# Patient Record
Sex: Male | Born: 1970 | Race: White | Hispanic: No | Marital: Single | Smoking: Never smoker
Health system: Southern US, Community
[De-identification: ages and names within clinical notes are randomized; demographics above are authoritative.]

---

## 2017-08-20 ENCOUNTER — Encounter (HOSPITAL_COMMUNITY): Payer: Self-pay

## 2017-08-20 ENCOUNTER — Emergency Department (HOSPITAL_COMMUNITY): Payer: Self-pay

## 2017-08-20 ENCOUNTER — Emergency Department (HOSPITAL_COMMUNITY)
Admission: EM | Admit: 2017-08-20 | Discharge: 2017-08-20 | Disposition: A | Discharge status: Home / Self Care | Payer: Self-pay | Attending: Emergency Medicine | Admitting: Emergency Medicine

## 2017-08-20 DIAGNOSIS — W010XXA Fall on same level from slipping, tripping and stumbling without subsequent striking against object, initial encounter: Secondary | ICD-10-CM | POA: Insufficient documentation

## 2017-08-20 DIAGNOSIS — Y939 Activity, unspecified: Secondary | ICD-10-CM | POA: Insufficient documentation

## 2017-08-20 DIAGNOSIS — S4991XA Unspecified injury of right shoulder and upper arm, initial encounter: Secondary | ICD-10-CM | POA: Insufficient documentation

## 2017-08-20 DIAGNOSIS — Y929 Unspecified place or not applicable: Secondary | ICD-10-CM | POA: Insufficient documentation

## 2017-08-20 DIAGNOSIS — Y999 Unspecified external cause status: Secondary | ICD-10-CM | POA: Insufficient documentation

## 2017-08-20 MED ORDER — CYCLOBENZAPRINE HCL 10 MG PO TABS
10.0000 mg | ORAL_TABLET | Freq: Once | ORAL | Status: DC
  Filled 2017-08-20: qty 1

## 2017-08-20 MED ORDER — CYCLOBENZAPRINE HCL 10 MG PO TABS: 10.0000 mg | ORAL_TABLET | Freq: Two times a day (BID) | ORAL | 0 refills | Status: AC | PRN

## 2017-08-20 MED ORDER — OXYCODONE-ACETAMINOPHEN 5-325 MG PO TABS
1.0000 | ORAL_TABLET | Freq: Once | ORAL | Status: DC
  Filled 2017-08-20: qty 1

## 2017-08-20 MED ORDER — IBUPROFEN 800 MG PO TABS
800.0000 mg | ORAL_TABLET | Freq: Once | ORAL | Status: AC
Start: 2017-08-20 — End: 2017-08-20
  Administered 2017-08-20: 800 mg via ORAL
  Filled 2017-08-20: qty 1

## 2017-08-20 MED ORDER — DICLOFENAC SODIUM 50 MG PO TBEC: 50.0000 mg | DELAYED_RELEASE_TABLET | Freq: Two times a day (BID) | ORAL | 0 refills | Status: AC

## 2017-08-20 NOTE — ED Triage Notes (Signed)
Pt reports that he fell last night when it was wet outside. Denies hitting head or LOC. Endorses R shoulder pain and a bump over his R shoulder. A&Ox4. Spanish speaking.

## 2017-08-20 NOTE — Discharge Instructions (Signed)
Do not drive while taking the muscle relaxer as it can make you sleepy. If your pain continues follow up with Dr. Luiz BlareGraves for further evaluation.

## 2017-08-20 NOTE — ED Notes (Signed)
When asking to perform d/c vitals. He states "No, Its okay."

## 2017-08-20 NOTE — ED Provider Notes (Signed)
[facility]-EMERGENCY DEPT Provider Note   CSN: [id number] Arrival date & time: 08/20/17  1647     History   Chief Complaint Chief Complaint  Patient presents with  . Fall  . Arm Pain    HPI [facility] is a 47 y.o. male who presents to the ED with right shoulder pain s/p fall that occurred last night. Patient reports he went outside and and it was wet causing him to slip and fall on concrete. He landed on his right shoulder. Patient has taken nothing for pain.   The history is provided by the patient. A language interpreter was used.  Fall  This is a new problem. The current episode started 12 to 24 hours ago. The problem occurs constantly. The problem has been gradually worsening. Exacerbated by: moving arm. Nothing relieves the symptoms. He has tried nothing for the symptoms.  Shoulder Injury  This is a new problem. The current episode started yesterday. The problem occurs constantly. The problem has been gradually worsening. Exacerbated by: movement of shoulder. Nothing relieves the symptoms. He has tried nothing for the symptoms.    History reviewed. No pertinent past medical history.  There are no active problems to display for this patient.   History reviewed. No pertinent surgical history.     Home Medications    Prior to Admission medications   Medication Sig Start Date End Date Taking? Authorizing Provider  cyclobenzaprine (FLEXERIL) 10 MG tablet Take 1 tablet (10 mg total) by mouth 2 (two) times daily as needed for muscle spasms. 08/20/17   Janne NapoleonNeese, Hope M, NP  diclofenac (VOLTAREN) 50 MG EC tablet Take 1 tablet (50 mg total) by mouth 2 (two) times daily. 08/20/17   Janne NapoleonNeese, Hope M, NP    Family History History reviewed. No pertinent family history.  Social History Social History   Tobacco Use  . Smoking status: Never Smoker  . Smokeless tobacco: Never Used  Substance Use Topics  . Alcohol use: Not on file  . Drug use: Not  on file     Allergies   Patient has no known allergies.   Review of Systems Review of Systems  Musculoskeletal: Positive for arthralgias.       Right shoulder pain  Skin: Negative for wound.  All other systems reviewed and are negative.    Physical Exam Updated Vital Signs BP 129/89 (BP Location: Left Arm)   Pulse 84   Temp 98.4 F (36.9 C) (Oral)   Resp 18   SpO2 99%   Physical Exam  Constitutional: He appears well-developed and well-nourished. No distress.  HENT:  Head: Normocephalic and atraumatic.  Eyes: EOM are normal.  Neck: Neck supple.  Cardiovascular: Normal rate.  Pulmonary/Chest: Effort normal.  Musculoskeletal:       Right shoulder: He exhibits decreased range of motion, tenderness, pain and spasm. He exhibits no deformity, no laceration, normal pulse and normal strength.  Grips are equal, radial pulses 2+. Tender with palpation to the anterior aspect of the right shoulder. Patient can raise his arm but causes pain.  Neurological: He is alert.  Skin: Skin is warm and dry.  Psychiatric: He has a normal mood and affect.  Nursing note and vitals reviewed.    ED Treatments / Results  Labs (all labs ordered are listed, but only abnormal results are displayed) Labs Reviewed - No data to display Radiology Dg Shoulder Right  Result Date: 08/20/2017 CLINICAL DATA:  Fall last night.  Right superior shoulder pain.  EXAM: RIGHT SHOULDER - 2+ VIEW COMPARISON:  None. FINDINGS: There is no evidence of fracture or dislocation. There is no evidence of arthropathy or other focal bone abnormality. Soft tissues are unremarkable. IMPRESSION: Negative. Electronically Signed   By: Amie Portland M.D.   On: 08/20/2017 19:24    Procedures Procedures (including critical care time)  Medications Ordered in ED Medications  cyclobenzaprine (FLEXERIL) tablet 10 mg (10 mg Oral Not Given 08/20/17 2006)  oxyCODONE-acetaminophen (PERCOCET/ROXICET) 5-325 MG per tablet 1 tablet (1  tablet Oral Not Given 08/20/17 2006)  ibuprofen (ADVIL,MOTRIN) tablet 800 mg (800 mg Oral Given 08/20/17 2005)     Initial Impression / Assessment and Plan / ED Course  I have reviewed the triage vital signs and the nursing notes. 47 y.o. male here with right shoulder pain s/p fall last night stable for d/c without fracture or dislocation noted on x-ray. And no focal neuro deficits.  Patient treated with sling and NSAIDS while in the ED. Patient reports decreased pain after treatment. Return precautions discussed. Patient to f/u with ortho if symptoms persist.   Final Clinical Impressions(s) / ED Diagnoses   Final diagnoses:  Right shoulder injury, initial encounter    ED Discharge Orders        Ordered    cyclobenzaprine (FLEXERIL) 10 MG tablet  2 times daily PRN     08/20/17 2052    diclofenac (VOLTAREN) 50 MG EC tablet  2 times daily     08/20/17 2052       Kerrie [place], [place] 08/20/17 1610    Pricilla Loveless, MD 08/20/17 [phone]

## 2017-08-20 NOTE — ED Notes (Signed)
Pt declined discharge vitals

## 2018-07-10 IMAGING — CR DG SHOULDER 2+V*R*
3 series · 3 of 3 positions shown · non-contrast
Comparison: None.

CLINICAL DATA: Fall last night.  Right superior shoulder pain.

EXAM:
RIGHT SHOULDER - 2+ VIEW

[w shoulder external right]
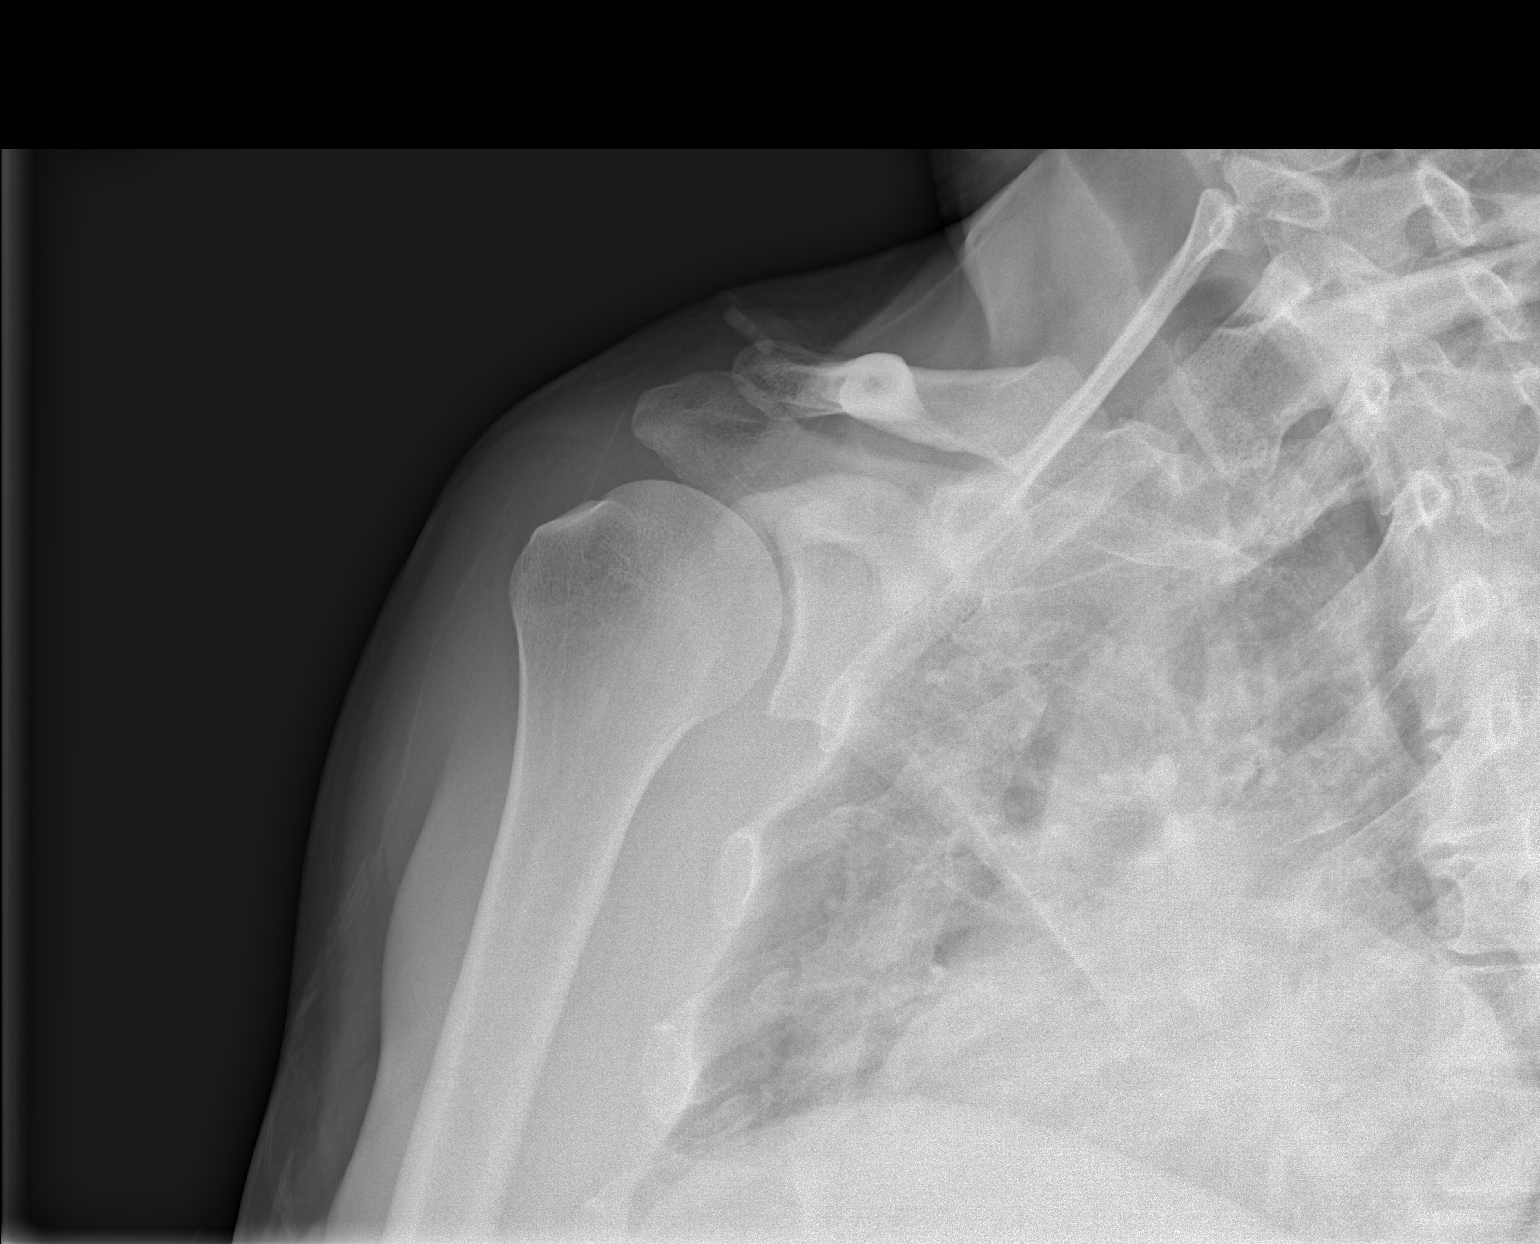

[w shoulder y-view right]
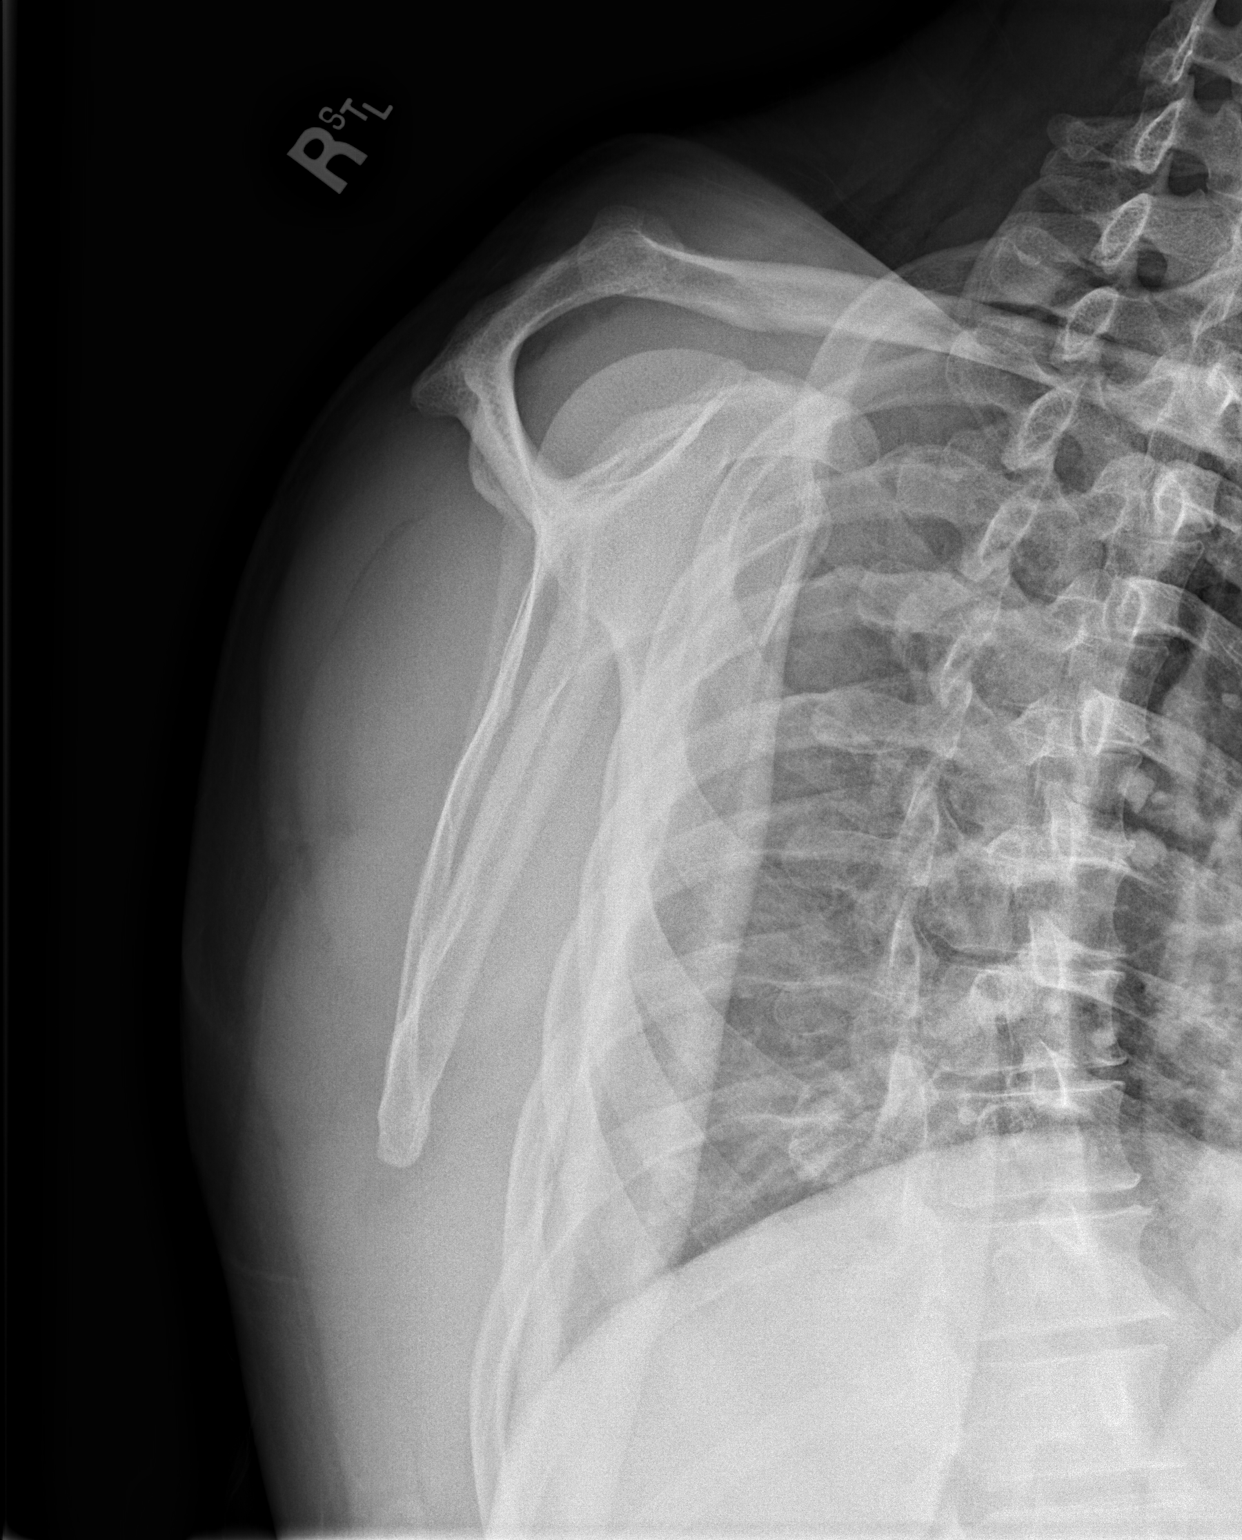

[x shoulder axillary right]
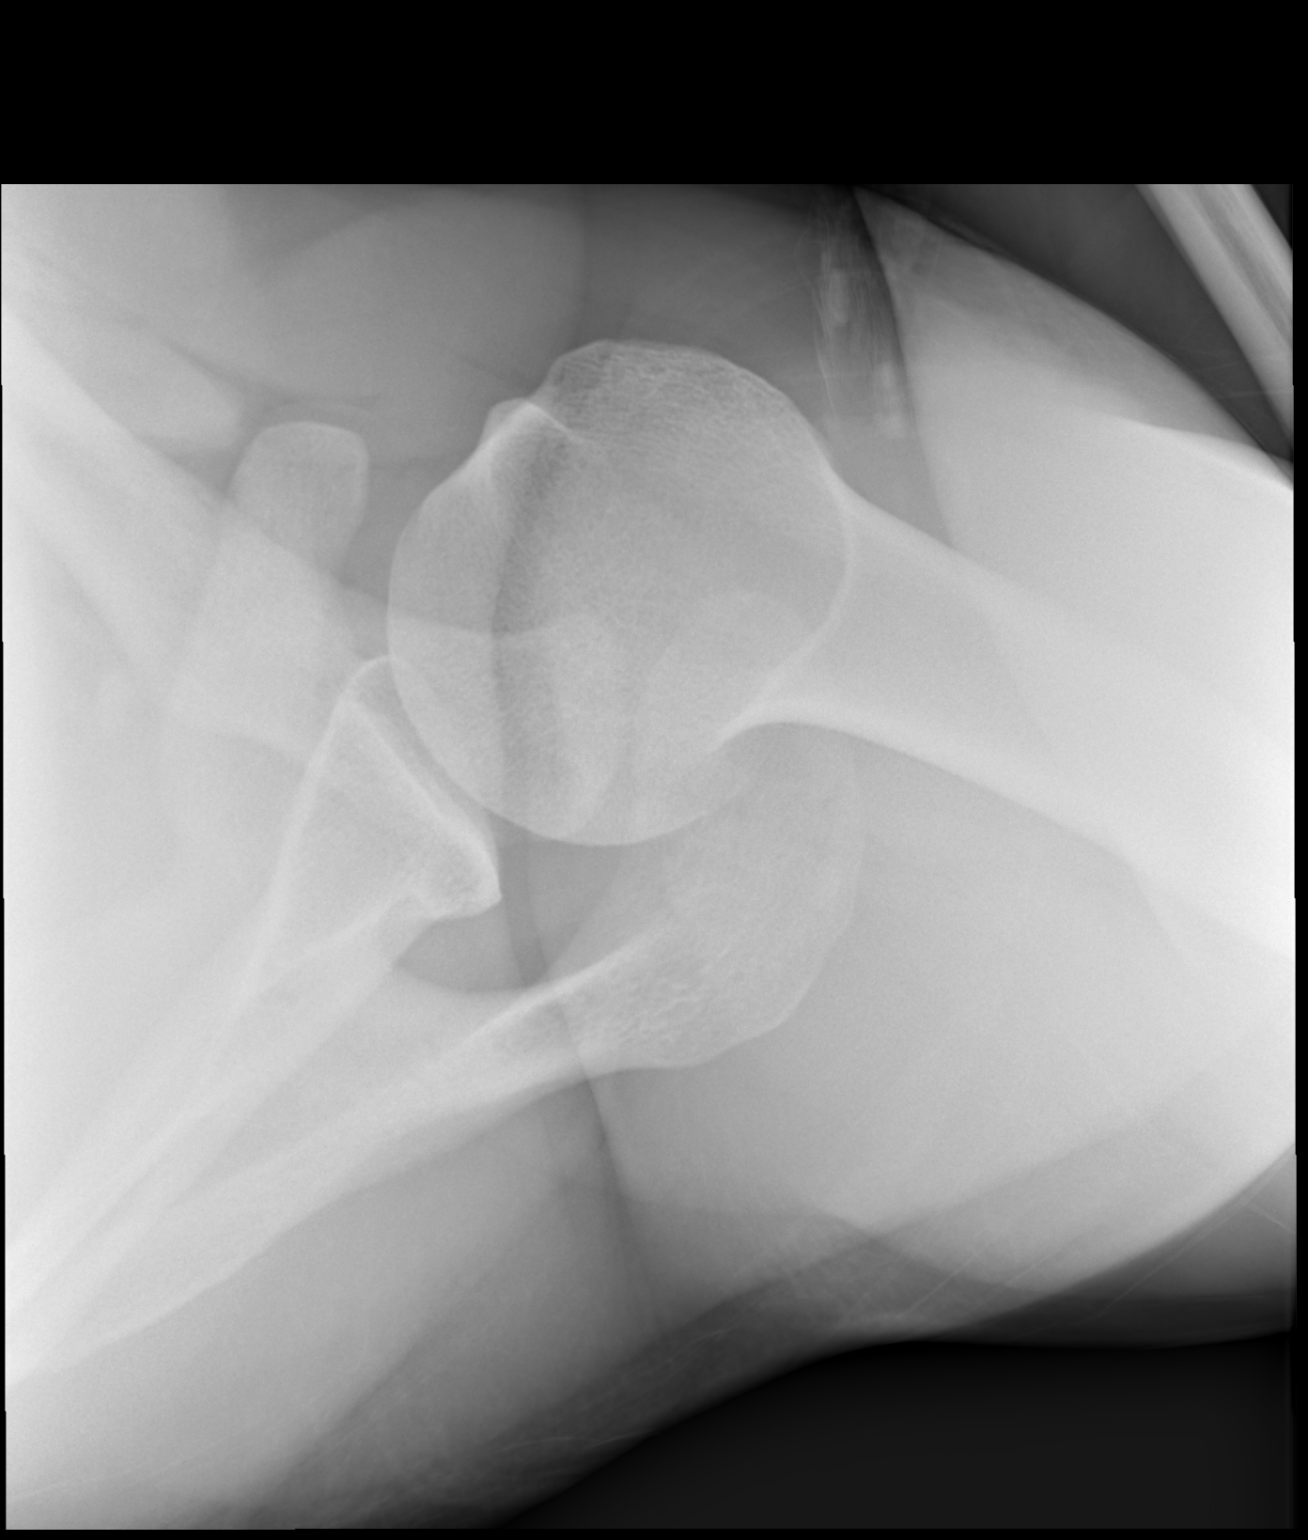

[3 of 3 positions shown; findings below may reference images not displayed]

FINDINGS: There is no evidence of fracture or dislocation. There is no
evidence of arthropathy or other focal bone abnormality. Soft
tissues are unremarkable.
IMPRESSION: Negative.
